# Patient Record
Sex: Female | Born: 1995 | Race: White | Hispanic: No | Marital: Single | Smoking: Never smoker
Health system: Southern US, Community
[De-identification: ages and names within clinical notes are randomized; demographics above are authoritative.]

## PROBLEM LIST (undated history)

## (undated) HISTORY — PX: TONSILLECTOMY: SUR1361

## (undated) HISTORY — PX: ANKLE SURGERY: SHX546

## (undated) HISTORY — PX: TONSILLECTOMY AND ADENOIDECTOMY: SHX28

---
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
## 2015-06-28 IMAGING — CR DG RIBS W/ CHEST 3+V*L*
3 series · 3 of 3 positions shown · non-contrast
Comparison: None.

CLINICAL DATA: Left-sided chest pain

EXAM:
LEFT RIBS AND CHEST - 3+ VIEW

[view not recorded (1 of 3)]
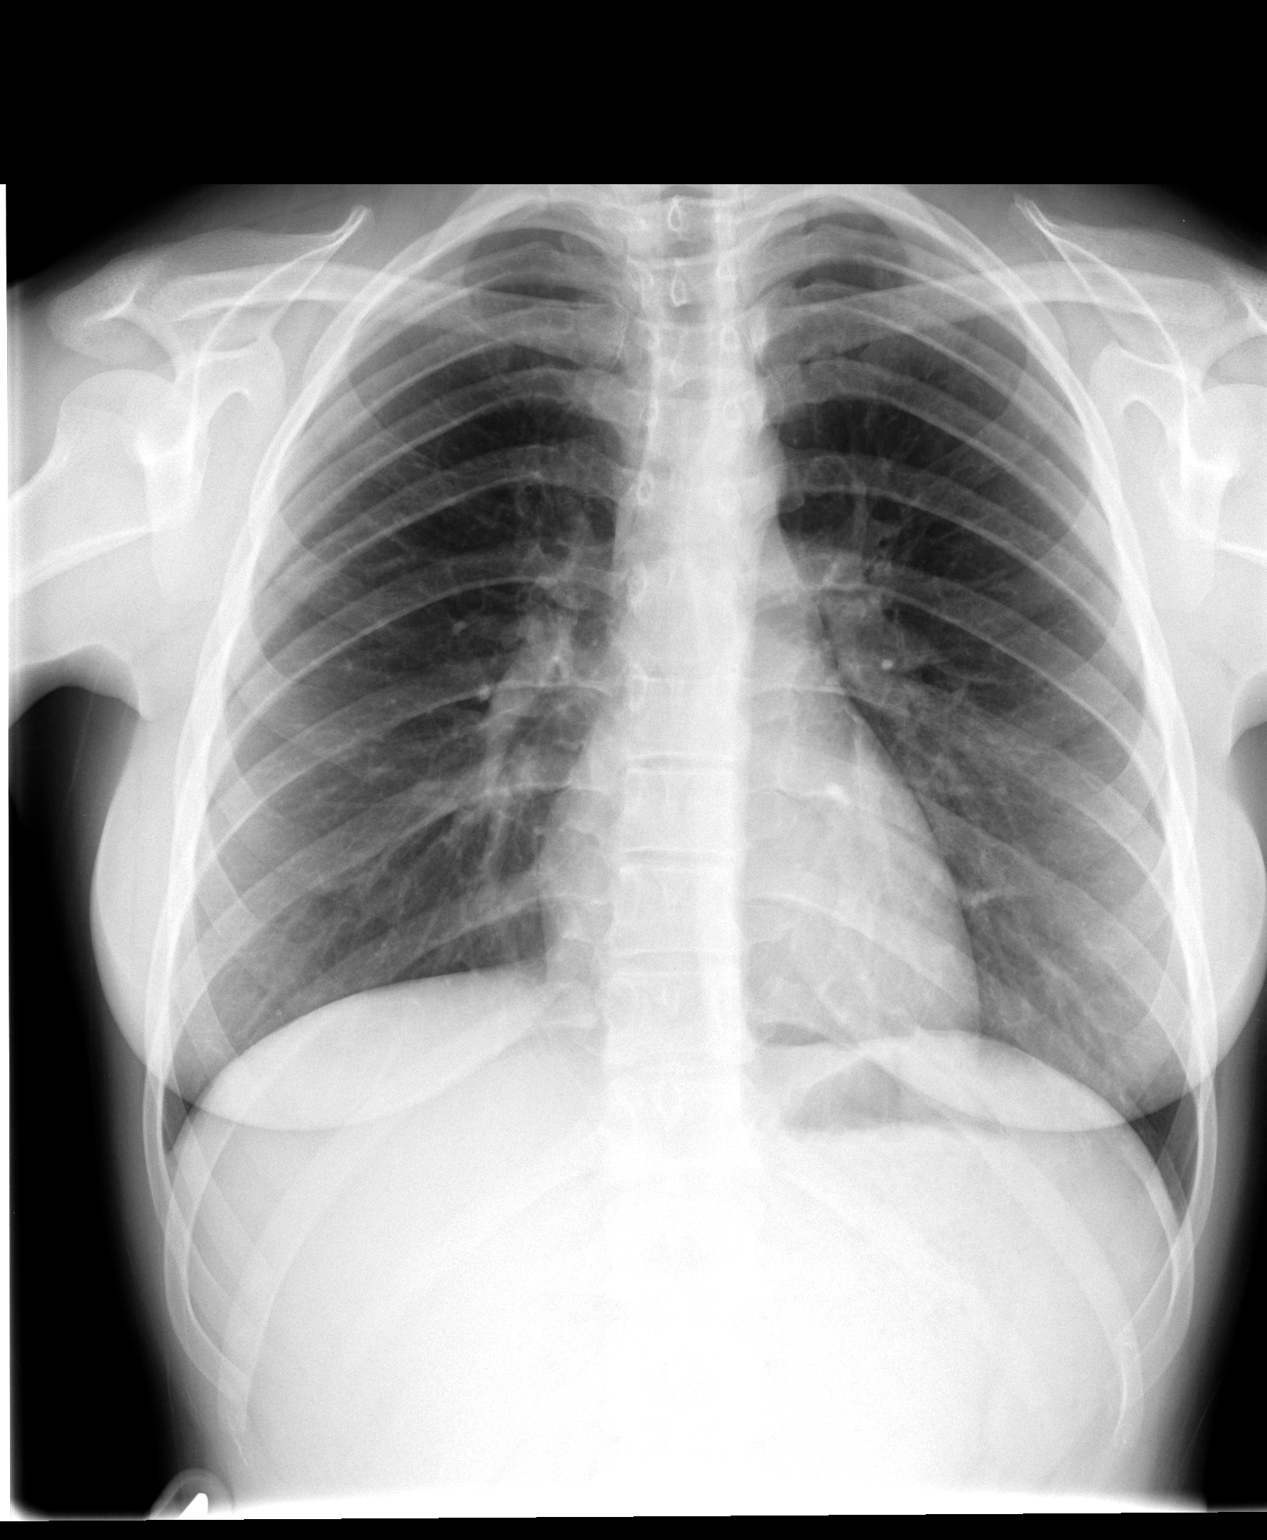

[view not recorded (2 of 3)]
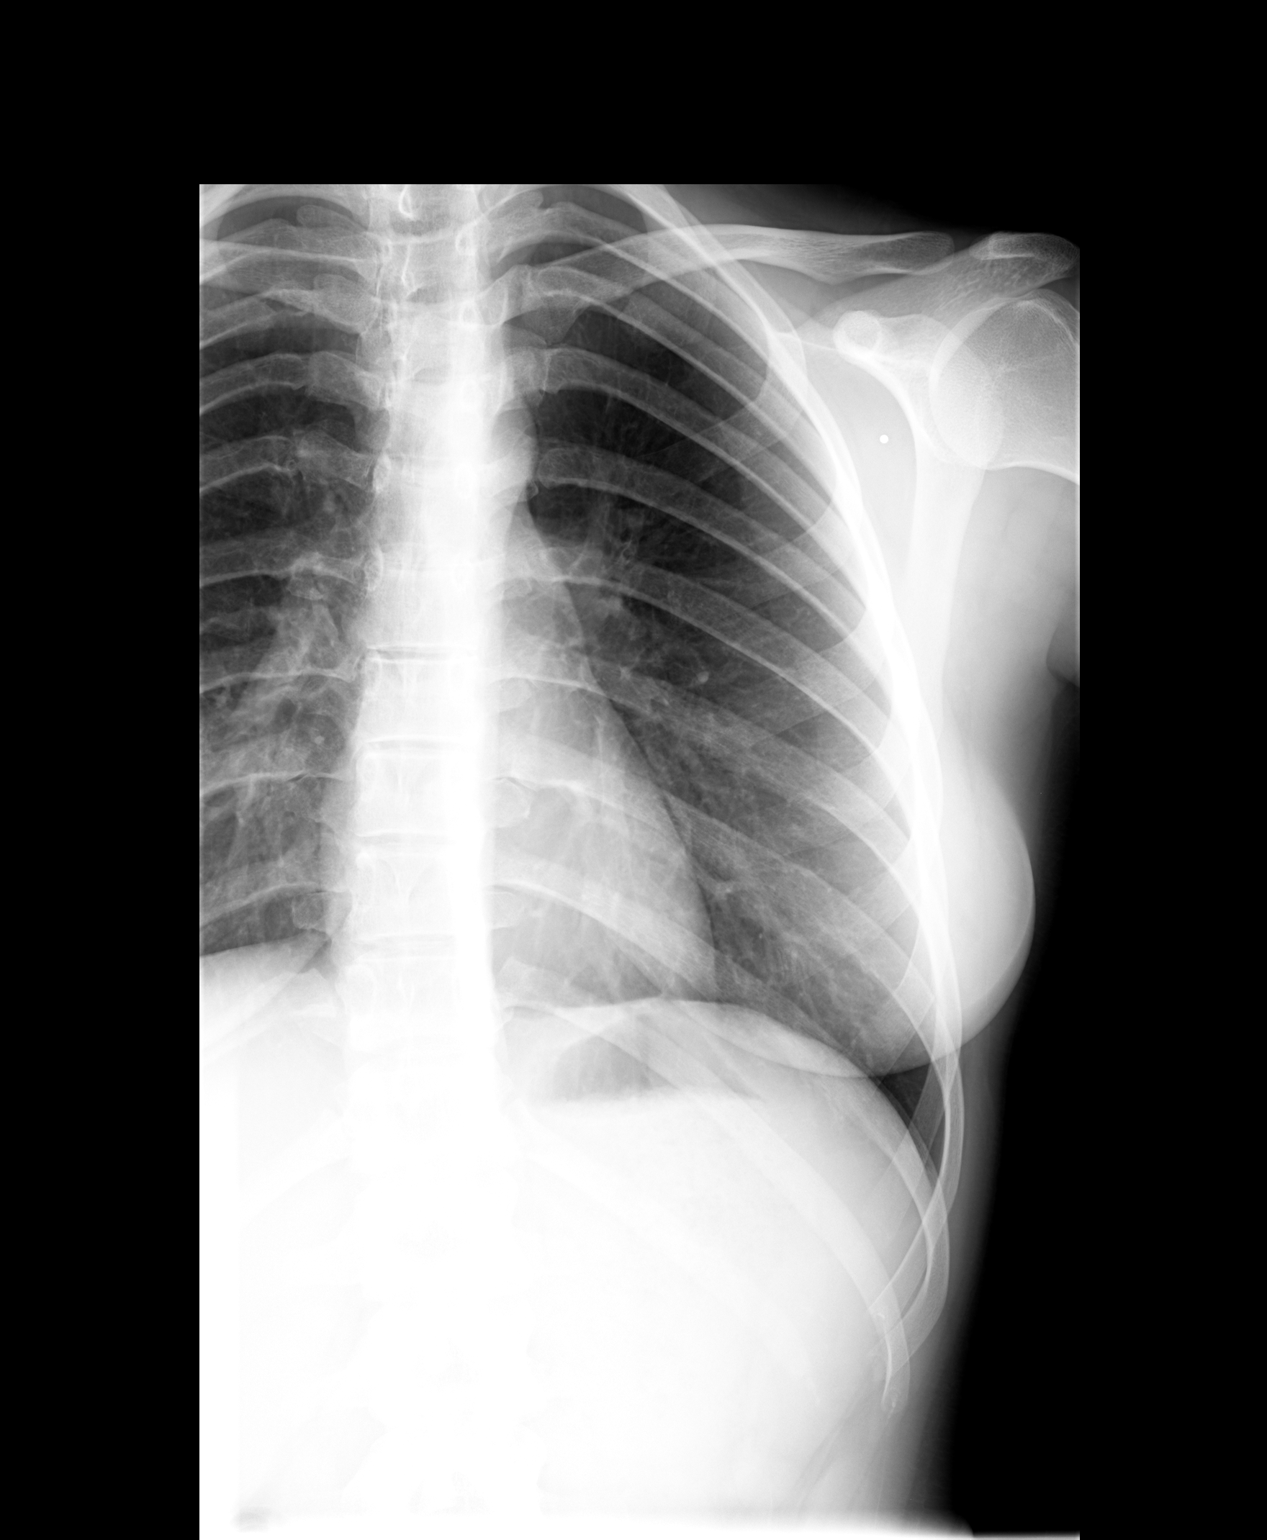

[view not recorded (3 of 3)]
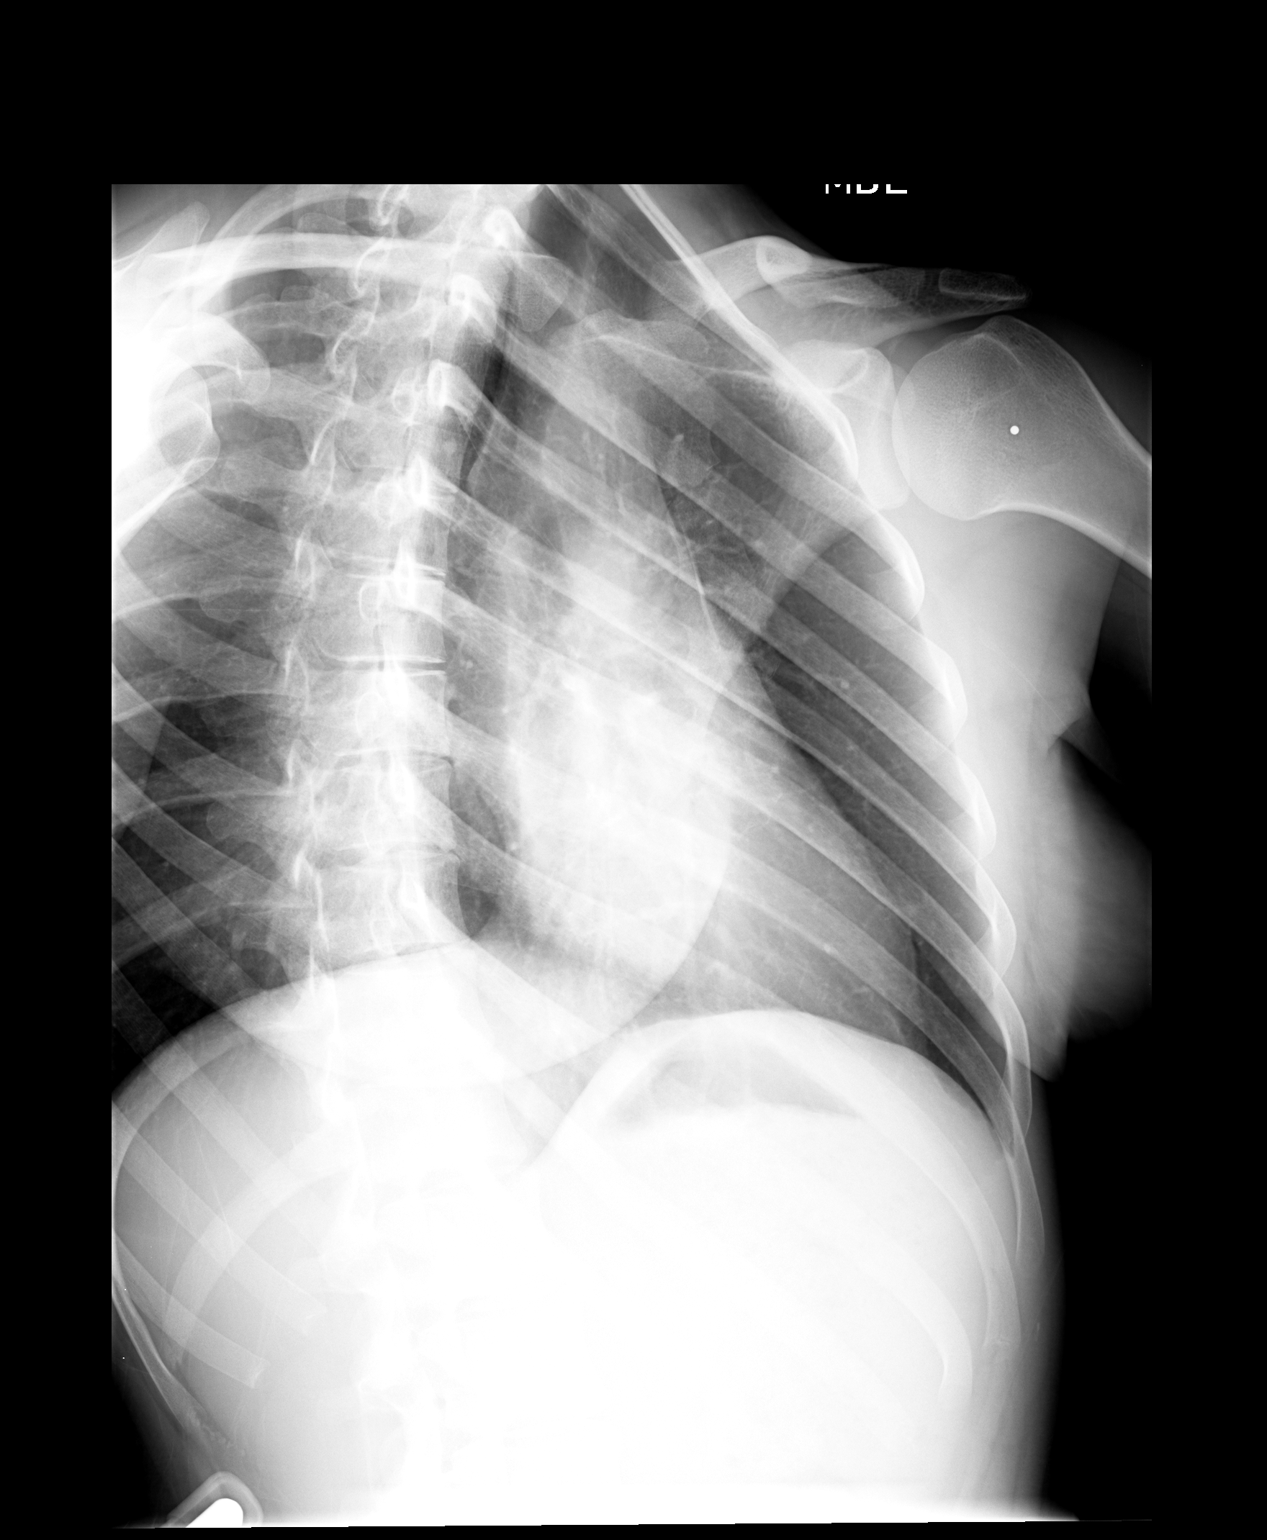

[3 of 3 positions shown; findings below may reference images not displayed]

FINDINGS: No fracture or other bone lesions are seen involving the ribs. There
is no evidence of pneumothorax or pleural effusion. Both lungs are
clear. Heart size and mediastinal contours are within normal limits.
IMPRESSION: No acute abnormality noted.

## 2016-01-15 DIAGNOSIS — H5213 Myopia, bilateral: Secondary | ICD-10-CM | POA: Diagnosis not present

## 2016-02-21 ENCOUNTER — Other Ambulatory Visit (HOSPITAL_COMMUNITY)
Admission: RE | Admit: 2016-02-21 | Discharge: 2016-02-21 | Disposition: A | Discharge status: Home / Self Care | Payer: 59 | Source: Ambulatory Visit | Attending: Family Medicine | Admitting: Family Medicine

## 2016-02-21 ENCOUNTER — Other Ambulatory Visit: Payer: Self-pay | Admitting: Family Medicine

## 2016-02-21 DIAGNOSIS — Z0000 Encounter for general adult medical examination without abnormal findings: Secondary | ICD-10-CM | POA: Diagnosis not present

## 2016-02-21 DIAGNOSIS — Z23 Encounter for immunization: Secondary | ICD-10-CM | POA: Diagnosis not present

## 2016-02-21 DIAGNOSIS — Z113 Encounter for screening for infections with a predominantly sexual mode of transmission: Secondary | ICD-10-CM | POA: Insufficient documentation

## 2016-02-21 DIAGNOSIS — Z124 Encounter for screening for malignant neoplasm of cervix: Secondary | ICD-10-CM | POA: Insufficient documentation

## 2016-02-21 DIAGNOSIS — Z209 Contact with and (suspected) exposure to unspecified communicable disease: Secondary | ICD-10-CM | POA: Diagnosis not present

## 2016-02-25 LAB — CYTOLOGY - PAP
